# Patient Record
Sex: Male | Born: 1975 | Hispanic: Yes | Marital: Single | Smoking: Never smoker
Health system: Southern US, Community
[De-identification: ages and names within clinical notes are randomized; demographics above are authoritative.]

---

## 2015-04-14 ENCOUNTER — Encounter (HOSPITAL_COMMUNITY): Payer: Self-pay | Admitting: *Deleted

## 2015-04-14 ENCOUNTER — Emergency Department (INDEPENDENT_AMBULATORY_CARE_PROVIDER_SITE_OTHER): Payer: Self-pay

## 2015-04-14 ENCOUNTER — Emergency Department (INDEPENDENT_AMBULATORY_CARE_PROVIDER_SITE_OTHER)
Admission: EM | Admit: 2015-04-14 | Discharge: 2015-04-14 | Disposition: A | Discharge status: Home / Self Care | Payer: Self-pay | Source: Home / Self Care | Attending: Family Medicine | Admitting: Family Medicine

## 2015-04-14 DIAGNOSIS — G8929 Other chronic pain: Secondary | ICD-10-CM

## 2015-04-14 DIAGNOSIS — M545 Low back pain, unspecified: Secondary | ICD-10-CM

## 2015-04-14 LAB — POCT URINALYSIS DIP (DEVICE)
BILIRUBIN URINE: NEGATIVE
Glucose, UA: NEGATIVE mg/dL
HGB URINE DIPSTICK: NEGATIVE
Ketones, ur: NEGATIVE mg/dL
LEUKOCYTES UA: NEGATIVE
NITRITE: NEGATIVE
Protein, ur: NEGATIVE mg/dL
Specific Gravity, Urine: 1.01 (ref 1.005–1.030)
Urobilinogen, UA: 0.2 mg/dL (ref 0.0–1.0)
pH: 7 (ref 5.0–8.0)

## 2015-04-14 MED ORDER — MELOXICAM 7.5 MG PO TABS: 7.5000 mg | ORAL_TABLET | Freq: Two times a day (BID) | ORAL | Status: AC

## 2015-04-14 NOTE — ED Provider Notes (Signed)
CSN: [id number]     Arrival date & time 04/14/15  1304 History   First MD Initiated Contact with Patient 04/14/15 1414     Chief Complaint  Patient presents with  . Back Pain   (Consider location/radiation/quality/duration/timing/severity/associated sxs/prior Treatment) Patient is a 39 y.o. male presenting with back pain. The history is provided by the patient. The history is limited by a language barrier. A language interpreter was used (care coord trans.).  Back Pain Location:  Lumbar spine Quality:  Stiffness Radiates to:  Does not radiate Pain severity:  Mild Onset quality:  Gradual Duration:  6 months Progression:  Unchanged Chronicity:  Chronic Context: not lifting heavy objects   Context comment:  Told 94mo ago he had kidney stone. Relieved by:  None tried Worsened by:  Nothing tried   History reviewed. No pertinent past medical history. History reviewed. No pertinent past surgical history. History reviewed. No pertinent family history. Social History  Substance Use Topics  . Smoking status: Never Smoker   . Smokeless tobacco: None  . Alcohol Use: No    Review of Systems  Cardiovascular: Negative.   Gastrointestinal: Negative.   Genitourinary: Negative.   Musculoskeletal: Positive for back pain. Negative for joint swelling and gait problem.  All other systems reviewed and are negative.   Allergies  Review of patient's allergies indicates no known allergies.  Home Medications   Prior to Admission medications   Medication Sig Start Date End Date Taking? Authorizing Provider  meloxicam (MOBIC) 7.5 MG tablet Take 1 tablet (7.5 mg total) by mouth 2 (two) times daily after a meal. 04/14/15   Linna HoffJames D Evgenia Merriman, MD   Meds Ordered and Administered this Visit  Medications - No data to display  BP 115/79 mmHg  Pulse 77  Temp(Src) 98 F (36.7 C) (Oral)  Resp 16  SpO2 96% No data found.   Physical Exam  Constitutional: He is oriented to person, place, and time. He  appears well-developed and well-nourished.  Abdominal: Soft. Bowel sounds are normal.  Musculoskeletal: He exhibits tenderness.       Lumbar back: He exhibits decreased range of motion, tenderness, bony tenderness, pain and spasm. He exhibits no swelling and normal pulse.  Neurological: He is alert and oriented to person, place, and time.  Skin: Skin is warm and dry.  Nursing note and vitals reviewed.   ED Course  Procedures (including critical care time)  Labs Review Labs Reviewed  POCT URINALYSIS DIP (DEVICE)    Imaging Review No results found. X-rays reviewed and report per radiologist.   Visual Acuity Review  Right Eye Distance:   Left Eye Distance:   Bilateral Distance:    Right Eye Near:   Left Eye Near:    Bilateral Near:         MDM   1. Low back pain of over 3 months duration        Linna HoffJames D Anaclara Acklin, MD 04/16/15 2042

## 2015-04-14 NOTE — Discharge Instructions (Signed)
Heat to back and use medicine as needed, see orthopedist for therapy if further problems.

## 2015-04-14 NOTE — ED Notes (Addendum)
Pt  Reports  Symptoms  Of  Back/  abd  Pain           Since  300  Am         -    Pt  Reports   Symptoms  Not  releived  By otc  meds      Reports    Had  Kidney   Stone  6  Months   Ago

## 2016-12-24 IMAGING — DX DG LUMBAR SPINE COMPLETE 4+V
5 series · 5 of 5 positions shown · non-contrast
Comparison: None.

CLINICAL DATA: Lumbago for 6 months

EXAM:
LUMBAR SPINE - COMPLETE 4+ VIEW

[l-spine ap]
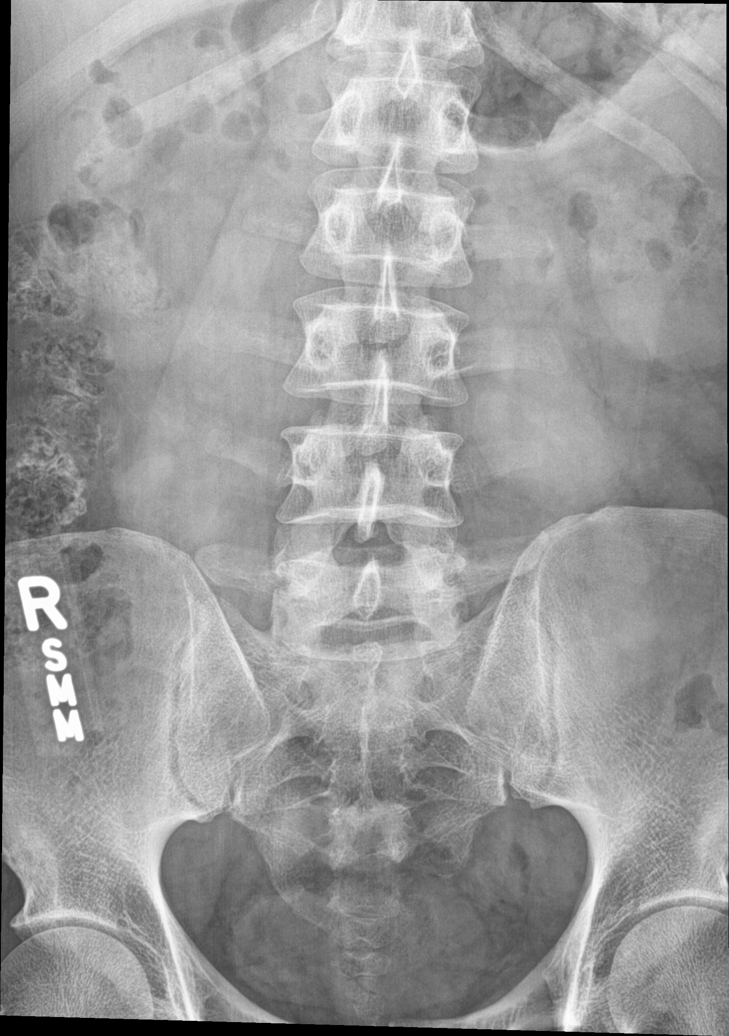

[l-spine obl (1 of 2)]
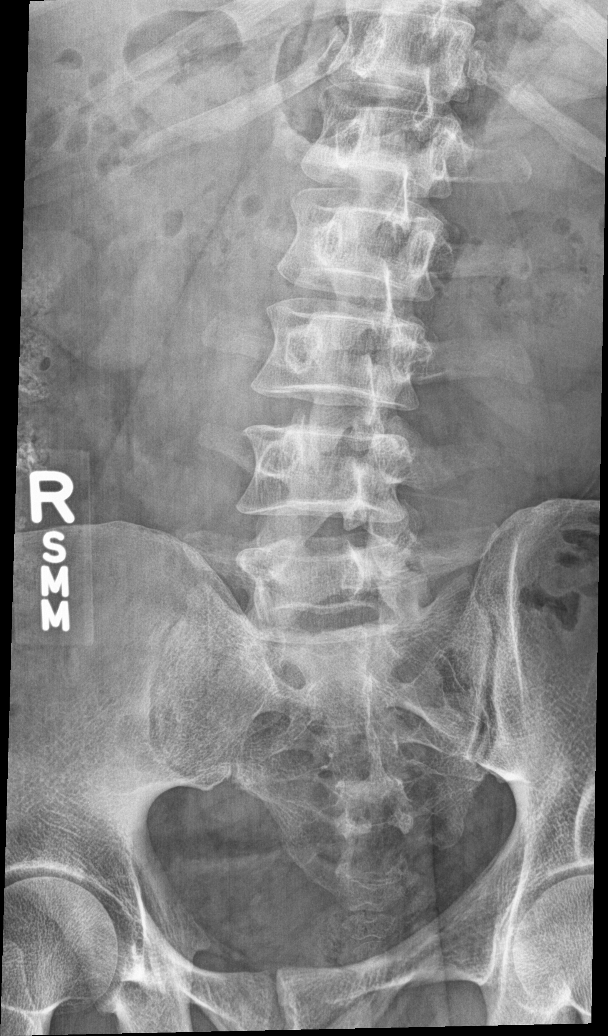

[l-spine obl (2 of 2)]
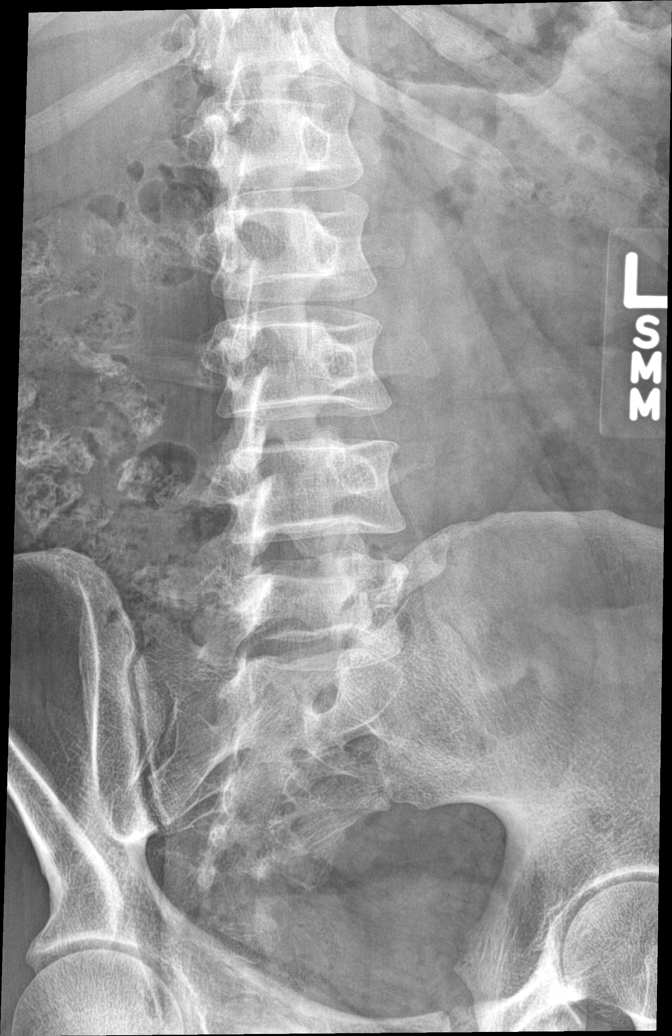

[l-spine lat]
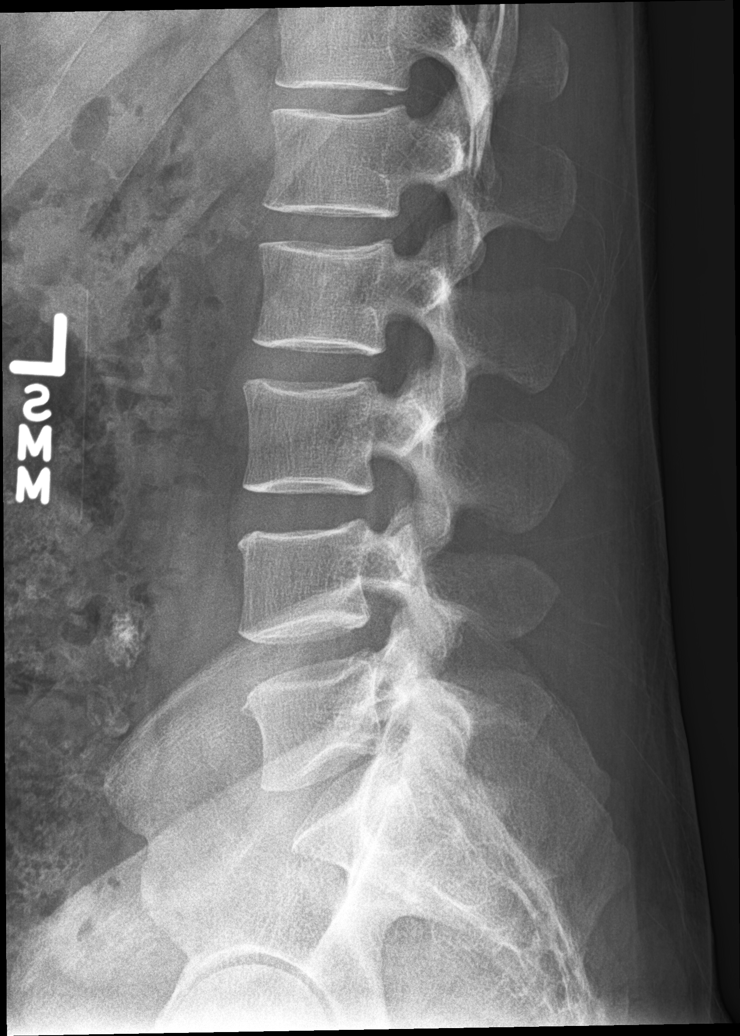

[l-spine spot]
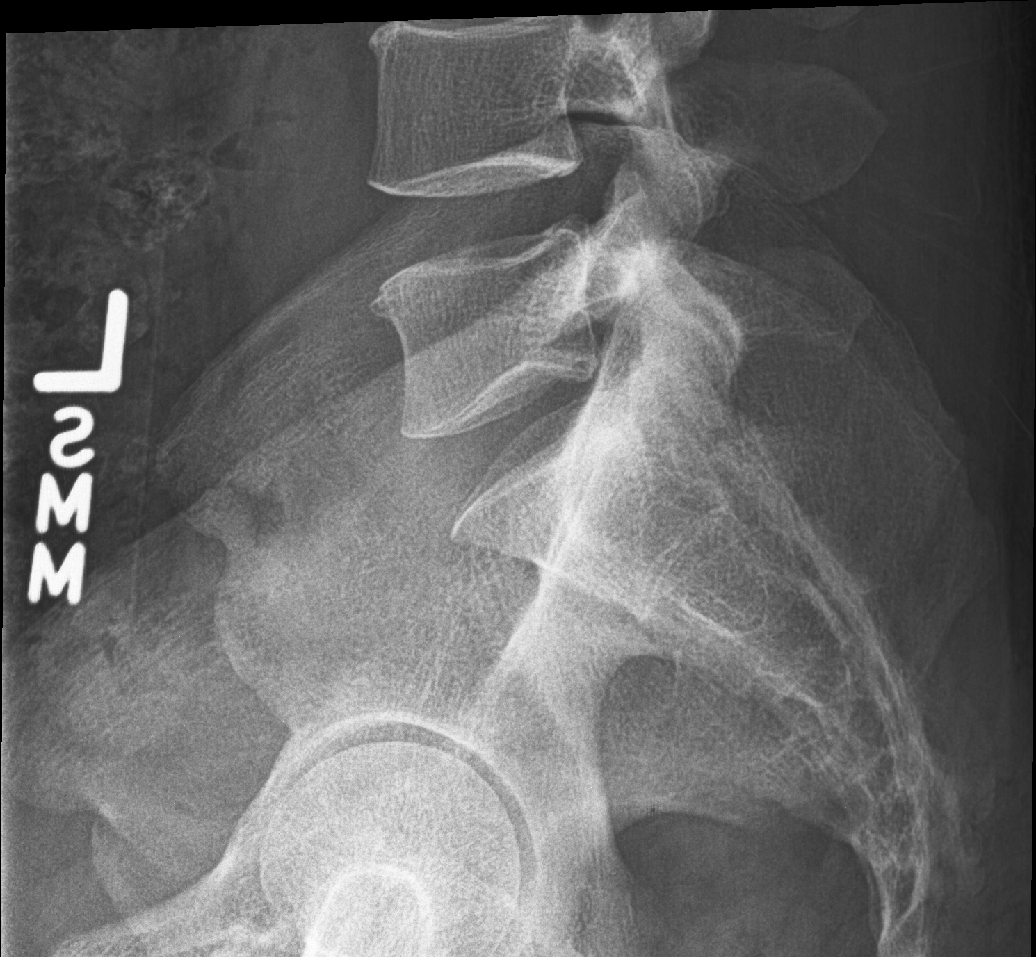

[5 of 5 positions shown; findings below may reference images not displayed]

FINDINGS: Frontal, lateral, spot lumbosacral lateral, and bilateral oblique
views were obtained. There are 5 non-rib-bearing lumbar type
vertebral bodies. There is slight lumbar dextroscoliosis. There is
no fracture or spondylolisthesis. Disc spaces appear normal at all
levels. There is no appreciable facet arthropathy.
IMPRESSION: Slight scoliosis. No fracture or spondylolisthesis. No appreciable
arthropathy.
